# Patient Record
Sex: Male | Born: 1960 | Race: White | Hispanic: No | Marital: Married | Smoking: Never smoker
Health system: Southern US, Community
[De-identification: ages and names within clinical notes are randomized; demographics above are authoritative.]

## PROBLEM LIST (undated history)

## (undated) DIAGNOSIS — K219 Gastro-esophageal reflux disease without esophagitis: Secondary | ICD-10-CM

## (undated) DIAGNOSIS — M51379 Other intervertebral disc degeneration, lumbosacral region without mention of lumbar back pain or lower extremity pain: Secondary | ICD-10-CM

## (undated) DIAGNOSIS — M5137 Other intervertebral disc degeneration, lumbosacral region: Secondary | ICD-10-CM

## (undated) HISTORY — DX: Gastro-esophageal reflux disease without esophagitis: K21.9

## (undated) HISTORY — PX: ESOPHAGEAL DILATION: SHX303

## (undated) HISTORY — DX: Other intervertebral disc degeneration, lumbosacral region without mention of lumbar back pain or lower extremity pain: M51.379

## (undated) HISTORY — DX: Other intervertebral disc degeneration, lumbosacral region: M51.37

---

## 2004-04-11 ENCOUNTER — Ambulatory Visit (HOSPITAL_COMMUNITY): Admission: RE | Admit: 2004-04-11 | Discharge: 2004-04-11 | Payer: Self-pay | Admitting: Family Medicine

## 2005-10-05 ENCOUNTER — Encounter: Payer: Self-pay | Admitting: Orthopedic Surgery

## 2006-04-03 ENCOUNTER — Ambulatory Visit (HOSPITAL_COMMUNITY): Admission: RE | Admit: 2006-04-03 | Discharge: 2006-04-03 | Payer: Self-pay | Admitting: Family Medicine

## 2007-03-12 ENCOUNTER — Ambulatory Visit: Payer: Self-pay | Admitting: Internal Medicine

## 2007-03-12 ENCOUNTER — Ambulatory Visit (HOSPITAL_COMMUNITY): Admission: RE | Admit: 2007-03-12 | Discharge: 2007-03-12 | Payer: Self-pay | Admitting: Internal Medicine

## 2008-05-29 ENCOUNTER — Ambulatory Visit (HOSPITAL_COMMUNITY): Admission: RE | Admit: 2008-05-29 | Discharge: 2008-05-29 | Payer: Self-pay | Admitting: Family Medicine

## 2009-02-01 ENCOUNTER — Ambulatory Visit: Payer: Self-pay | Admitting: Orthopedic Surgery

## 2009-02-01 DIAGNOSIS — M19049 Primary osteoarthritis, unspecified hand: Secondary | ICD-10-CM | POA: Insufficient documentation

## 2009-02-01 DIAGNOSIS — M25559 Pain in unspecified hip: Secondary | ICD-10-CM | POA: Insufficient documentation

## 2009-04-29 ENCOUNTER — Ambulatory Visit (HOSPITAL_COMMUNITY): Admission: RE | Admit: 2009-04-29 | Discharge: 2009-04-29 | Payer: Self-pay | Admitting: Family Medicine

## 2009-04-29 ENCOUNTER — Encounter: Payer: Self-pay | Admitting: Orthopedic Surgery

## 2009-05-03 ENCOUNTER — Encounter (INDEPENDENT_AMBULATORY_CARE_PROVIDER_SITE_OTHER): Payer: Self-pay | Admitting: *Deleted

## 2009-05-03 ENCOUNTER — Ambulatory Visit: Payer: Self-pay | Admitting: Orthopedic Surgery

## 2009-05-03 DIAGNOSIS — M48061 Spinal stenosis, lumbar region without neurogenic claudication: Secondary | ICD-10-CM | POA: Insufficient documentation

## 2009-05-03 DIAGNOSIS — M5126 Other intervertebral disc displacement, lumbar region: Secondary | ICD-10-CM | POA: Insufficient documentation

## 2009-05-03 DIAGNOSIS — IMO0002 Reserved for concepts with insufficient information to code with codable children: Secondary | ICD-10-CM | POA: Insufficient documentation

## 2009-05-04 ENCOUNTER — Encounter (INDEPENDENT_AMBULATORY_CARE_PROVIDER_SITE_OTHER): Payer: Self-pay | Admitting: *Deleted

## 2009-05-04 ENCOUNTER — Encounter: Admission: RE | Admit: 2009-05-04 | Discharge: 2009-05-04 | Payer: Self-pay | Admitting: Orthopedic Surgery

## 2009-05-18 ENCOUNTER — Encounter: Admission: RE | Admit: 2009-05-18 | Discharge: 2009-05-18 | Payer: Self-pay | Admitting: Orthopedic Surgery

## 2009-08-09 ENCOUNTER — Encounter: Payer: Self-pay | Admitting: Orthopedic Surgery

## 2009-08-10 ENCOUNTER — Encounter (INDEPENDENT_AMBULATORY_CARE_PROVIDER_SITE_OTHER): Payer: Self-pay | Admitting: *Deleted

## 2009-08-10 ENCOUNTER — Encounter: Admission: RE | Admit: 2009-08-10 | Discharge: 2009-08-10 | Payer: Self-pay | Admitting: Orthopedic Surgery

## 2009-08-12 ENCOUNTER — Telehealth: Payer: Self-pay | Admitting: Orthopedic Surgery

## 2009-08-17 ENCOUNTER — Encounter (HOSPITAL_COMMUNITY): Admission: RE | Admit: 2009-08-17 | Discharge: 2009-09-16 | Payer: Self-pay | Admitting: Orthopedic Surgery

## 2009-11-29 ENCOUNTER — Encounter: Payer: Self-pay | Admitting: Orthopedic Surgery

## 2010-03-14 ENCOUNTER — Ambulatory Visit: Payer: Self-pay | Admitting: Orthopedic Surgery

## 2010-03-14 DIAGNOSIS — M542 Cervicalgia: Secondary | ICD-10-CM | POA: Insufficient documentation

## 2010-03-17 ENCOUNTER — Telehealth: Payer: Self-pay | Admitting: Orthopedic Surgery

## 2010-03-21 ENCOUNTER — Encounter: Payer: Self-pay | Admitting: Orthopedic Surgery

## 2010-03-24 ENCOUNTER — Encounter: Payer: Self-pay | Admitting: Orthopedic Surgery

## 2010-09-16 IMAGING — CR DG LUMBAR SPINE COMPLETE 4+V
5 series · 5 of 5 positions shown · non-contrast
Comparison: None

CLINICAL DATA: Low back pain, no known injury

LUMBAR SPINE - COMPLETE 4+ VIEW

[view not recorded (1 of 5)]
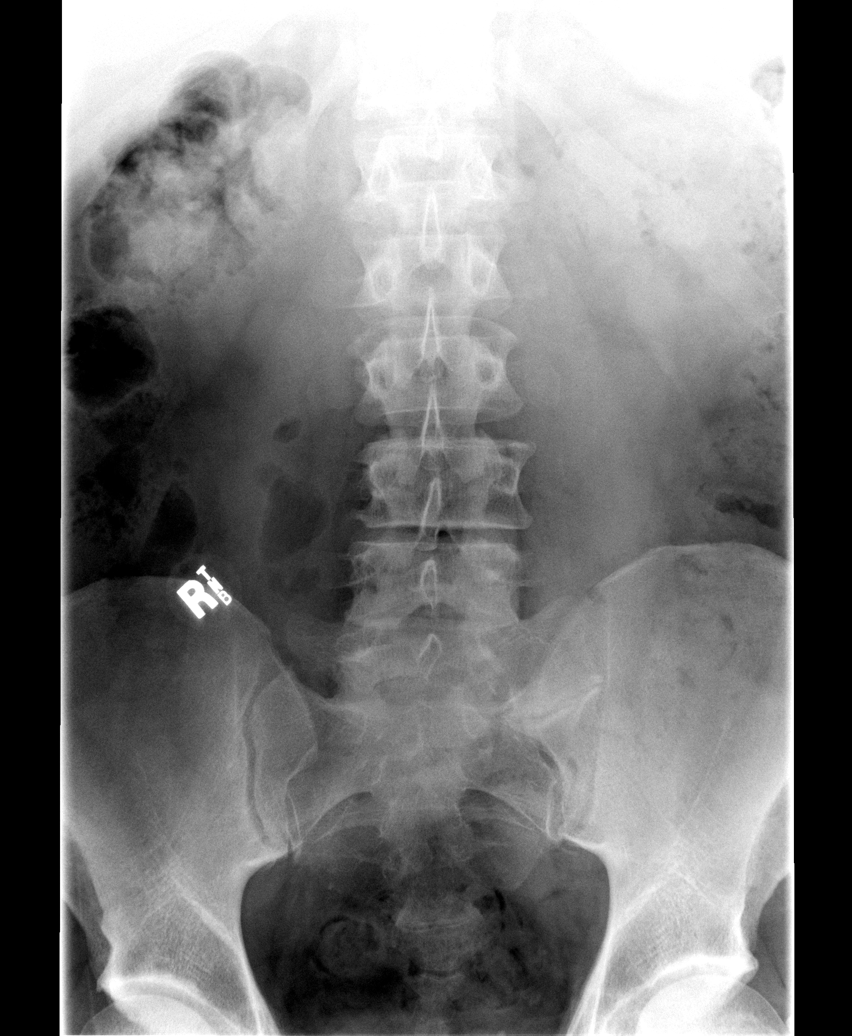

[view not recorded (2 of 5)]
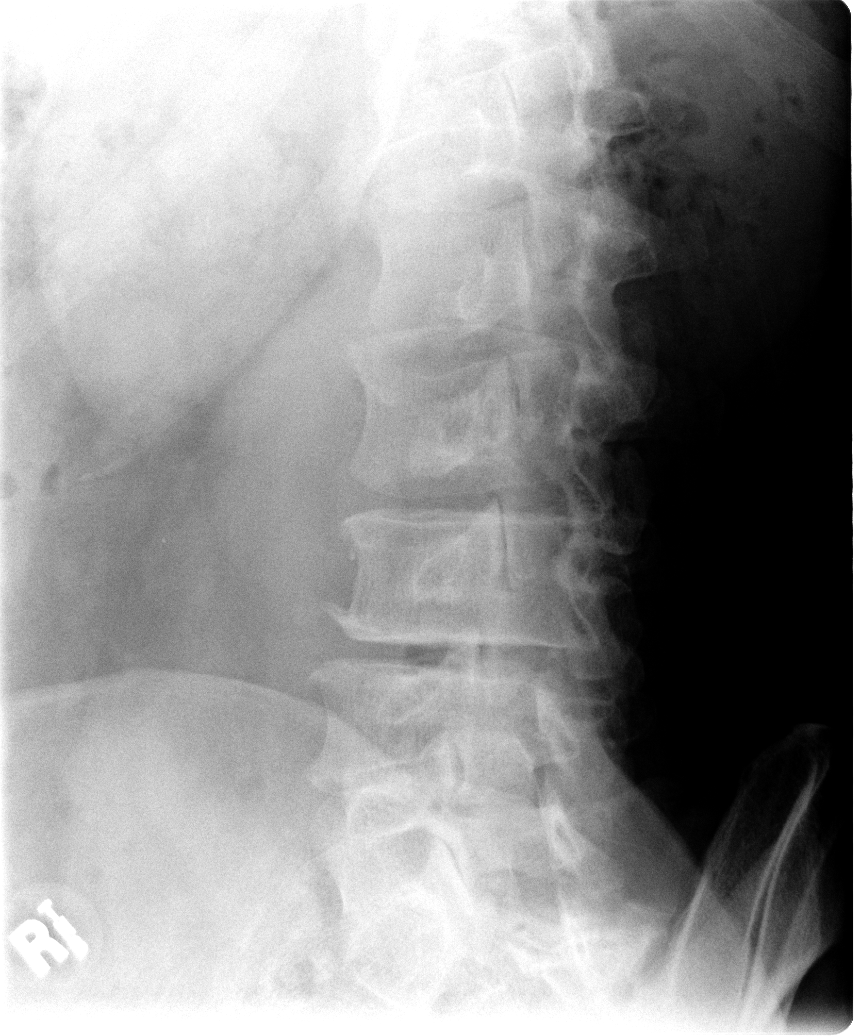

[view not recorded (3 of 5)]
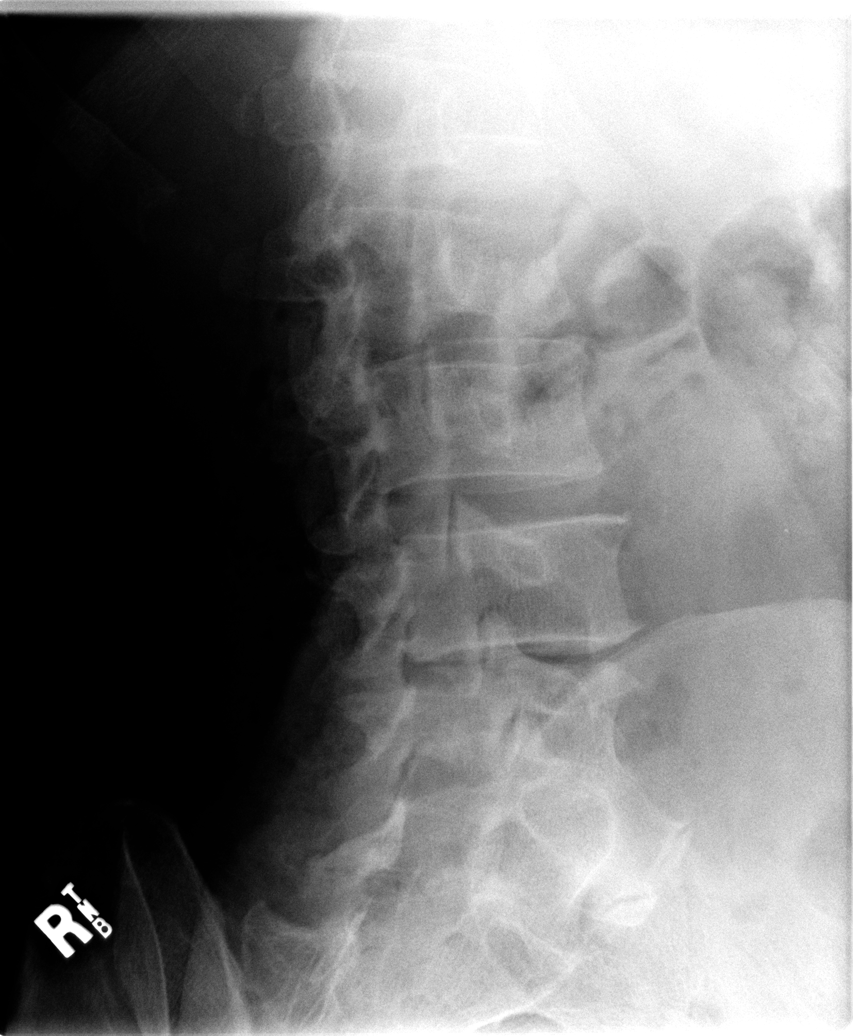

[view not recorded (4 of 5)]
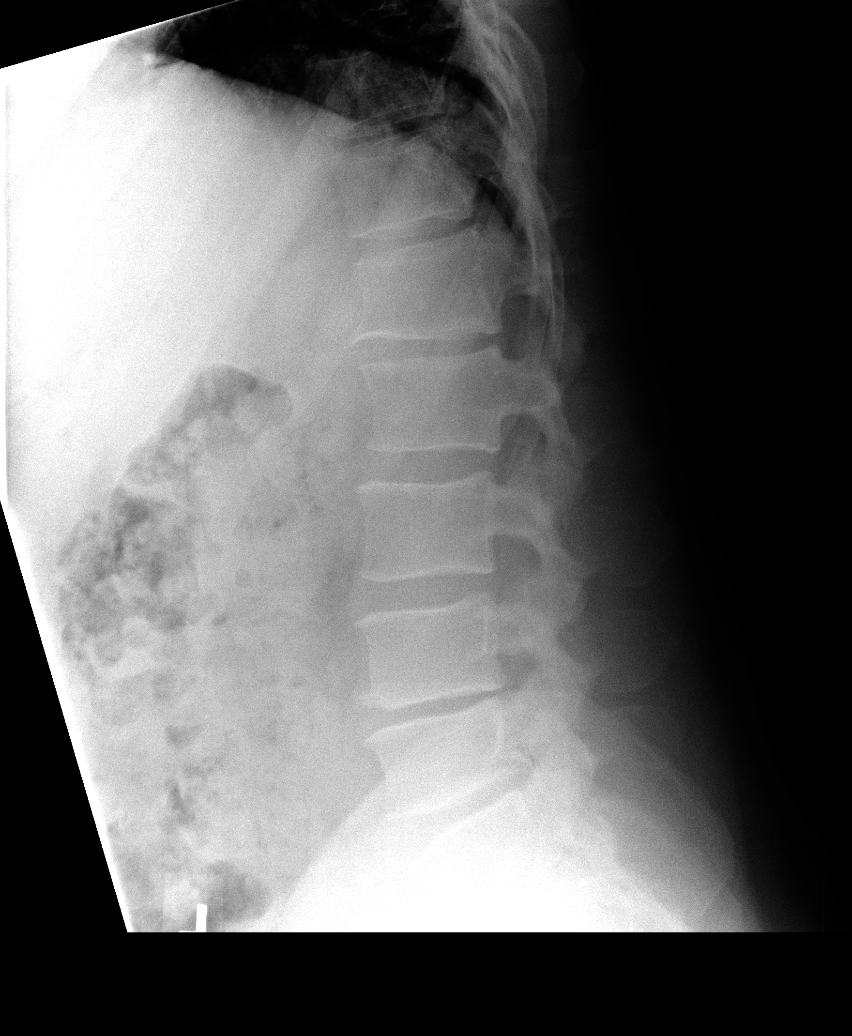

[view not recorded (5 of 5)]
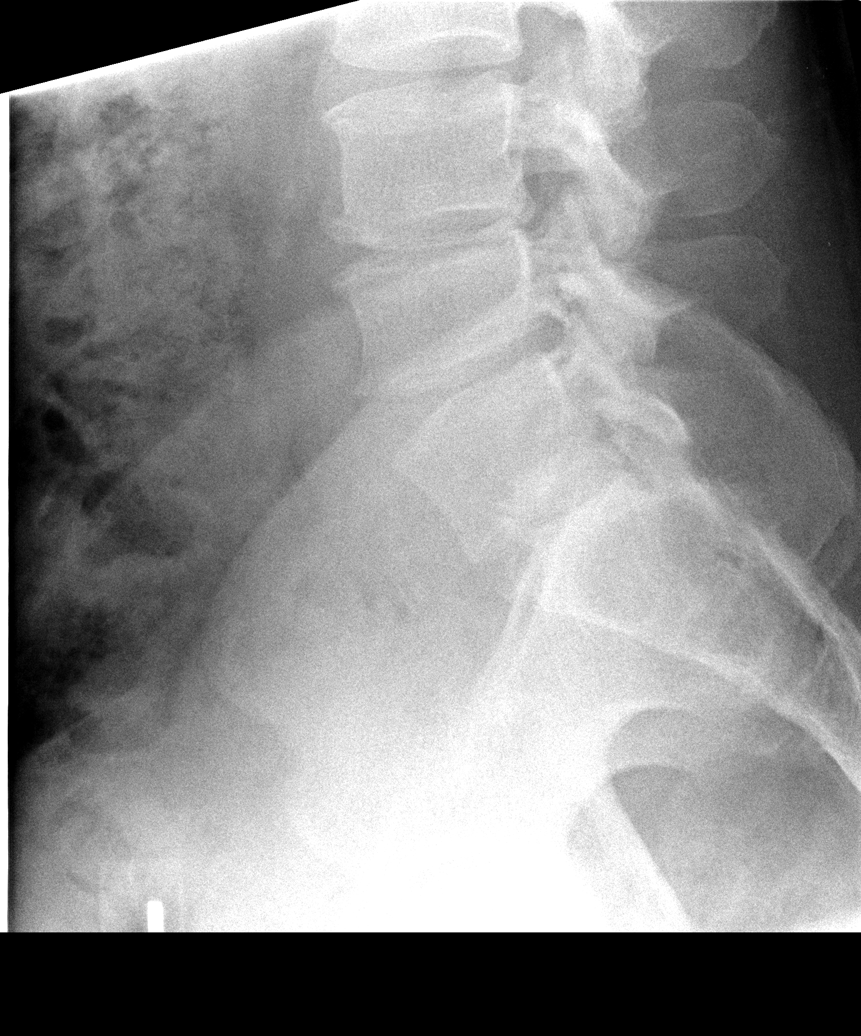

[5 of 5 positions shown; findings below may reference images not displayed]

FINDINGS: The lumbar vertebrae are in normal alignment.  There is
degenerative disc disease at L3-4 and to a lesser great L4-5 and L5-
S1.  No compression deformity is seen.  There is some degenerative
change involving the facet joints of the lower lumbar spine as
well.  The SI joints appear normal.
IMPRESSION: Degenerative changes of the lower lumbar spine as described above.
No acute compression deformity.

## 2011-01-24 NOTE — Letter (Signed)
----   Converted from flag ---- ---- 03/18/2010 12:45 PM, Fuller Canada MD wrote:  ------------------------------

## 2011-01-24 NOTE — Progress Notes (Signed)
Summary: please call or stop by to see this patient  Phone Note Call from Patient Call back at Home Phone [phone]   Summary of Call: will you please call this patient at [phone] or stop by his office today. Initial call taken by: Ether Griffins,  March 17, 2010 3:46 PM

## 2011-01-24 NOTE — Assessment & Plan Note (Signed)
Summary: NEW PROBLEM/SHOULDER PAIN/?XRAY/CAF   History of Present Illness: 50 year old male history of cervical disc disease treated with physical therapy and TENS unit as well as traction in the past presents now with cervical and left-sided neck pain.  Seems to have normal strength no radicular signs or symptoms tenderness over the trapezius and cervical spine level CV and 6  Recommend exercises, TENS unit, traction, flectorr patches,  Allergies: No Known Drug Allergies   Other Orders: No Charge Patient Arrived (NCPA0) (NCPA0)

## 2011-01-24 NOTE — Letter (Signed)
Summary: History form  History form   Imported By: Jacklynn Ganong 03/31/2010 08:14:07  _____________________________________________________________________  External Attachment:    Type:   Image     Comment:   External Document

## 2011-01-24 NOTE — Letter (Signed)
Summary: Office note from Dr. Channing Mutters  Office note from Dr. Channing Mutters   Imported By: Jacklynn Ganong 12/28/2009 15:47:32  _____________________________________________________________________  External Attachment:    Type:   Image     Comment:   External Document

## 2011-01-24 NOTE — Consult Note (Signed)
Summary: Consult [facility] Dr Channing Mutters  Consult [facility] Dr Channing Mutters   Imported By: Cammie Sickle 12/28/2009 18:53:00  _____________________________________________________________________  External Attachment:    Type:   Image     Comment:   External Document

## 2011-03-08 ENCOUNTER — Ambulatory Visit (HOSPITAL_COMMUNITY)
Admission: RE | Admit: 2011-03-08 | Discharge: 2011-03-08 | Disposition: A | Discharge status: Home / Self Care | Payer: BC Managed Care – PPO | Source: Ambulatory Visit | Attending: Internal Medicine | Admitting: Internal Medicine

## 2011-03-08 ENCOUNTER — Other Ambulatory Visit (INDEPENDENT_AMBULATORY_CARE_PROVIDER_SITE_OTHER): Payer: Self-pay | Admitting: Internal Medicine

## 2011-03-08 ENCOUNTER — Encounter (HOSPITAL_BASED_OUTPATIENT_CLINIC_OR_DEPARTMENT_OTHER): Payer: BC Managed Care – PPO | Admitting: Internal Medicine

## 2011-03-08 DIAGNOSIS — Z1211 Encounter for screening for malignant neoplasm of colon: Secondary | ICD-10-CM | POA: Insufficient documentation

## 2011-03-08 DIAGNOSIS — D126 Benign neoplasm of colon, unspecified: Secondary | ICD-10-CM

## 2011-03-08 DIAGNOSIS — K5730 Diverticulosis of large intestine without perforation or abscess without bleeding: Secondary | ICD-10-CM | POA: Insufficient documentation

## 2011-03-08 DIAGNOSIS — K644 Residual hemorrhoidal skin tags: Secondary | ICD-10-CM

## 2011-03-28 NOTE — Op Note (Signed)
  NAME:  Tyler Buchanan, Tyler Buchanan                 ACCOUNT NO.:  [id number]  MEDICAL RECORD NO.:  [id number]           PATIENT TYPE:  O  LOCATION:  DAYP                          FACILITY:  [facility]  PHYSICIAN:  Lionel December, M.D.    DATE OF BIRTH:  1961-11-05  DATE OF PROCEDURE: DATE OF DISCHARGE:                              OPERATIVE REPORT   PROCEDURE:  Colonoscopy.  INDICATIONS:  Tyler Buchanan is a 50 year old Caucasian male, who is undergoing average risk screening colonoscopy.  Procedure and risks were reviewed with the patient.  Informed consent was obtained.  MEDS FOR CONSCIOUS SEDATION:  Demerol 50 mg, IV Versed 10 mg IV.  FINDINGS:  Procedure performed in endoscopy suite.  The patient's vital signs and O2 sat were monitored during the procedure and remained stable.  The patient was placed in left lateral position.  Rectal examination performed.  No abnormality noted on external or digital exam.  Pentax videoscope was placed through rectum and advanced under vision and beyond.  Preparation was excellent.  He had a single small diverticulum at sigmoid colon.  Scope was passed into cecum, which was identified by appendiceal orifice and ileocecal valve.  Pictures taken for the record.  As the scope was withdrawn, colonic mucosa was carefully examined.  There was 5-6 mm polyp at distal sigmoid colon and possibly hyperplastic polyp.  This was ablated via cold biopsy.  The rest of the colon was normal.  Rectal mucosa similarly was normal. Scope was retroflexed to examine anorectal junction and small hemorrhoids noted above and below the dentate line.  Endoscope was then withdrawn.  Withdrawal time was 9 minutes.  The patient tolerated the procedure well.  FINAL DIAGNOSIS:  Examination performed to cecum.  Single diverticulum at sigmoid colon.  Small polyp ablated via cold biopsy from sigmoid colon.  RECOMMENDATIONS:  Stent instructions given.  I will be contacting the patient with results of  biopsy and further recommendations.     Lionel December, M.D.     NR/MEDQ  D:  03/08/2011  T:  03/08/2011  Job:  [id number]  cc:   Kirk Ruths, M.D. Fax: [phone]  Electronically Signed by Lionel December M.D. on 03/28/2011 09:58:22 AM

## 2011-04-04 ENCOUNTER — Telehealth: Payer: Self-pay | Admitting: Orthopedic Surgery

## 2011-04-04 ENCOUNTER — Ambulatory Visit (INDEPENDENT_AMBULATORY_CARE_PROVIDER_SITE_OTHER): Payer: BC Managed Care – PPO | Admitting: Orthopedic Surgery

## 2011-04-04 ENCOUNTER — Encounter: Payer: Self-pay | Admitting: Orthopedic Surgery

## 2011-04-04 DIAGNOSIS — M542 Cervicalgia: Secondary | ICD-10-CM

## 2011-04-04 MED ORDER — PREDNISONE (PAK) 10 MG PO TABS: 10.0000 mg | ORAL_TABLET | Freq: Every day | ORAL | Status: DC

## 2011-04-04 MED ORDER — METHOCARBAMOL 500 MG PO TABS: 500.0000 mg | ORAL_TABLET | Freq: Four times a day (QID) | ORAL | Status: DC

## 2011-04-04 NOTE — Progress Notes (Signed)
50 year old male with a history of previous evaluation for neck and shoulder pain treated for cervical spondylosis with traction, anti-inflammatories, and physical therapy. Presents back with one-week headaches and neck pain on the LEFT side radiating to the LEFT shoulder blade. Denies weakness, numbness, or tingling. Does complain of stiffness in his cervical spine with crepitance and flexing, extending, and lateral bend  Physical exam shows he does have stiffness of the cervical spine, especially with rotation to his LEFT and lateral bend to the LEFT. He has tenderness at the base of the cervical spine and palpation retrieved. The headaches.  He has some pain on the medial border of the LEFT scapula as well.  His upper extremities show no malalignment. Strength is normal. The shoulder down through his hand with equal reflexes and normal sensation.  Cervical spine x-ray was obtained that show spondylosis at C4-C5 and C5-C6. He has also lost the normal cervical lordosis. Impression cervical disc herniation versus spondylosis/spinal stenosis.  Recommend oral prednisone and Robaxin.  MRI cervical spine to evaluate disc

## 2011-04-04 NOTE — Patient Instructions (Signed)
MRI   Start medications

## 2011-04-04 NOTE — Telephone Encounter (Signed)
Called [employer] ph [phone]; reached automated system. Verified eligibility.  Left message re: pre-cert requirement for MRI C-spine w/o contrast, CPT [phone] (Direct to diagnostic imaging pre-cert: ph [phone])    Attempted online contact also - found new updated system online, not working.  * NOTE: PATIENT REQUESTS OPEN MRI, [facility] ph [phone], where he has had previous imaging.

## 2011-04-05 NOTE — Telephone Encounter (Signed)
Per call to [employer] fol/up on MRI pre-cert-fwd'd to Imaging pre-auth provider "[facility]" @ph  [phone], pre-authorization is required.  I received pre-cert for MRI, CPT [id number], per Albin Felling B:  # [id number]; effective 30 days, 04/05/11-05/04/11.  Patient prefers [facility] for location, as has had previous imaging at this location. Requests open MRI. Call to [facility], ph [phone], fax [phone], for open MRI spoke w/Sarah.  Appointment scheduled at this location ([address], [place]) for 04/06/11, 12:00 noon register for 12:30pm MRI.  Order faxed.  Called patient w/appointment information, including follow up appointment here for results.

## 2011-04-12 ENCOUNTER — Telehealth: Payer: Self-pay | Admitting: Orthopedic Surgery

## 2011-04-12 NOTE — Telephone Encounter (Signed)
Patient relates did not have the scheduled MRI (appointment was 04/10/11) at [facility] as he said his neck and shoulder feeling much better.  Assumes he is to cancel the MRI follow up appointment for tomorrow 04/12/11. His cell # is [phone]

## 2011-04-13 ENCOUNTER — Ambulatory Visit: Payer: BC Managed Care – PPO | Admitting: Orthopedic Surgery

## 2011-05-12 NOTE — Op Note (Signed)
NAME:  Tyler Buchanan, Tyler Buchanan                 ACCOUNT NO.:  [id number]   MEDICAL RECORD NO.:  [id number]          PATIENT TYPE:  AMB   LOCATION:  DAY                           FACILITY:  [facility]   PHYSICIAN:  Lionel December, M.D.    DATE OF BIRTH:  05/04/1961   DATE OF PROCEDURE:  03/12/2007  DATE OF DISCHARGE:                               OPERATIVE REPORT   Esophagogastroduodenoscopy with esophageal dilation.   INDICATION:  Delton See is a 50 year old Caucasian male with a four month  history of dysphagia to solids.  He also was having frequent heartburn  but this has been well controlled with a PPI.  The procedure risks were  reviewed with the patient and informed consent was obtained.   MEDS FOR CONSCIOUS SEDATION:  Benzocaine spray for pharyngeal topical  anesthesia, Demerol 100 mg IV in divided dose, Versed 18 mg IV in  divided dose, promethazine 12.5 mg IV.   FINDINGS:  The procedure was performed in the endoscopy suite.  The  patient's vital signs and O2 sat were monitored during procedure and  remained stable.  The patient was placed in the left lateral position  and the Pentax videoscope was passed via oropharynx without any  difficulty into esophagus, but he pulled the scope out.  It was hard to  sedate him and Phenergan dose seemed to help.  He was finally sedated to  the point the scope could be passed with him not trying to pull it out.   Esophagus:  The mucosa of the esophagus was normal.  No ring or  stricture was noted.  There was a 5 mm erosion at the GE junction which  appeared to be on the way to healing.  The GE junction was wavy.  It was  at 40 cm from the incisors and the hiatus was at 43.  A small sliding  hiatal hernia.   Stomach:  It was empty and distended very well with insufflation.  The  folds of the proximal stomach were normal.  Examination of the mucosa at  body, antrum, pyloric channel, as well as angularis, fundus and cardia  was normal.   Duodenum:  The  bulbar mucosa was normal.  The scope was passed to the  second part of the duodenum where mucosa and folds were normal.  The  endoscope was withdrawn.   The esophagus was dilated by passing a 56-French Maloney dilator to full  insertion.  As the dilators was withdrawn, the endoscope was passed  again and no disruption noted to esophageal mucosa.  The endoscope was  withdrawn.  The patient tolerated the procedure well.   FINAL DIAGNOSIS:  Erosive reflux esophagitis with small sliding hiatal  hernia but no evidence of stricture or of web formation.  The esophagus  was dilated by passing the 56-French [facility] dilator without mucosal  disruptions.  Normal exam of the stomach, first and second part of the  duodenum.   RECOMMENDATIONS:  1. Antireflux measures reinforced.  2. Will increase omeprazole to 20 mg p.o. b.i.d., prescription given      for 60 with  five refills.  3. He will call us with a progress report next week.  If he remains      with dysphagia, we will bring him back for barium pill study.      Lionel December, M.D.  Electronically Signed     NR/MEDQ  D:  03/12/2007  T:  03/12/2007  Job:  [id number]   cc:   Kirk Ruths, M.D.  Fax: [phone]

## 2011-06-05 ENCOUNTER — Telehealth: Payer: Self-pay | Admitting: Orthopedic Surgery

## 2011-06-05 ENCOUNTER — Other Ambulatory Visit: Payer: Self-pay | Admitting: Orthopedic Surgery

## 2011-06-05 DIAGNOSIS — M545 Low back pain, unspecified: Secondary | ICD-10-CM

## 2011-06-05 DIAGNOSIS — M79604 Pain in right leg: Secondary | ICD-10-CM

## 2011-06-05 NOTE — Telephone Encounter (Signed)
Ok to repeat 

## 2011-06-05 NOTE — Telephone Encounter (Signed)
Forwarding to nurse re: order for ESI per Dr. Mort Sawyers response "okay to repeat."

## 2011-06-05 NOTE — Telephone Encounter (Signed)
Patient stopped in, Dr. Romeo Apple out of office. Patient requests order foranother epidural steroid injection, as states his lower back is "really hurting in the same spot."  He had 3 ESI's at [facility] in 2010.  Insurance is same [employer] as per last office visit in 2012.  His cell # is [phone].  Please advise.

## 2011-06-05 NOTE — Telephone Encounter (Signed)
Faxed order over to [facility]

## 2011-06-06 ENCOUNTER — Ambulatory Visit
Admission: RE | Admit: 2011-06-06 | Discharge: 2011-06-06 | Disposition: A | Discharge status: Home / Self Care | Payer: BC Managed Care – PPO | Source: Ambulatory Visit | Attending: Orthopedic Surgery | Admitting: Orthopedic Surgery

## 2011-06-06 DIAGNOSIS — M545 Low back pain, unspecified: Secondary | ICD-10-CM

## 2011-06-06 DIAGNOSIS — M79604 Pain in right leg: Secondary | ICD-10-CM

## 2011-06-07 ENCOUNTER — Other Ambulatory Visit: Payer: BC Managed Care – PPO

## 2011-06-14 ENCOUNTER — Other Ambulatory Visit: Payer: Self-pay | Admitting: Orthopedic Surgery

## 2011-06-14 DIAGNOSIS — M545 Low back pain, unspecified: Secondary | ICD-10-CM

## 2011-06-14 DIAGNOSIS — M79604 Pain in right leg: Secondary | ICD-10-CM

## 2011-06-20 ENCOUNTER — Ambulatory Visit
Admission: RE | Admit: 2011-06-20 | Discharge: 2011-06-20 | Disposition: A | Discharge status: Home / Self Care | Payer: BC Managed Care – PPO | Source: Ambulatory Visit | Attending: Orthopedic Surgery | Admitting: Orthopedic Surgery

## 2011-06-20 DIAGNOSIS — M79604 Pain in right leg: Secondary | ICD-10-CM

## 2011-06-20 DIAGNOSIS — M545 Low back pain, unspecified: Secondary | ICD-10-CM

## 2011-12-04 ENCOUNTER — Ambulatory Visit (INDEPENDENT_AMBULATORY_CARE_PROVIDER_SITE_OTHER): Payer: BC Managed Care – PPO | Admitting: Orthopedic Surgery

## 2011-12-04 ENCOUNTER — Encounter: Payer: Self-pay | Admitting: Orthopedic Surgery

## 2011-12-04 ENCOUNTER — Ambulatory Visit: Payer: BC Managed Care – PPO | Admitting: Orthopedic Surgery

## 2011-12-04 DIAGNOSIS — M1710 Unilateral primary osteoarthritis, unspecified knee: Secondary | ICD-10-CM

## 2011-12-04 DIAGNOSIS — IMO0002 Reserved for concepts with insufficient information to code with codable children: Secondary | ICD-10-CM

## 2011-12-04 NOTE — Progress Notes (Signed)
The patient reports pain in his RIGHT foot. Initially after playing golf and then passing to get better, but she started having medial knee pain. He is also having a re\re currents of his previously diagnosed lumbar disc problems status post 2 epidurals. He is considering a third.  The pain on the medial side of the knee is dull burning. It seems to be present when he is ambulating and walking. The pain is between a 5 and an 8 although he does take 2 Aleve on an as-needed basis. Is not having any bowel or bladder dysfunction. At this time.  He picked up some heavy about a week or 2 ago and started having recurrence of symptoms in his back and RIGHT leg up to the level of his knee. This is a separate pain from the one felt on the medial side of the knee.  He denies catching, locking, or giving way. He does have some seasonal allergies.  He has no major surgeries listed and he is not or does not report any major medical problems.  Exam reveals well-developed, well-nourished, male coming in hygiene is normal. He is oriented x3. His mood is pleasant. End plate without assistive device and without a limp. His RIGHT foot is tender over the posterior tibial tendon. 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 His ankle is stable. His tendon shrink seems normal. However. The primary area of tenderness is over the posterior medial malleolus. Skin is intact. Ankle joint is stable. Good pulse in temperature noted and the limb.  Knee is tender over the medial joint line. Full range of motion is recorded. There is no swelling, and meniscal signs are negative. Muscle tone is normal.  We did shoot an x-ray of his knee, which shows mild symmetric joint space narrowing, primarily medial greater than lateral.  Impression #1 mild arthritis, RIGHT knee. Impression #2 mild tendinitis posterior tibial tendon, RIGHT foot and ankle. Impression #3 recurrent chronic  lumbar disc disease.  Plan recommend Aspercreme and continued medication over-the-counter for the knee as he is not having any catching, locking, or giving way.  Plan #2 Spenco for orthotics.  Plan #3. He will seek a third epidural injection.

## 2011-12-04 NOTE — Progress Notes (Signed)
Separate x-ray report 3 views, RIGHT knee.  Diagnosis RIGHT knee pain, possible arthritis.  AP, lateral, and patellar views of the RIGHT knee are obtained.  There is symmetric. Mild joint space narrowing. Appears to affect the medial compartment more than lateral. No other abnormalities are seen.  Impression normal alignment of the RIGHT knee with mild arthritis.

## 2011-12-04 NOTE — Patient Instructions (Signed)
ORTHOTICS FOR YOUR FEET   ASPERCREME TWICE A DAY APPLY TO SORE AREA ON KNEE

## 2011-12-06 ENCOUNTER — Other Ambulatory Visit: Payer: Self-pay | Admitting: Orthopedic Surgery

## 2011-12-06 ENCOUNTER — Ambulatory Visit
Admission: RE | Admit: 2011-12-06 | Discharge: 2011-12-06 | Disposition: A | Discharge status: Home / Self Care | Payer: BC Managed Care – PPO | Source: Ambulatory Visit | Attending: Orthopedic Surgery | Admitting: Orthopedic Surgery

## 2011-12-06 ENCOUNTER — Telehealth: Payer: Self-pay | Admitting: Radiology

## 2011-12-06 DIAGNOSIS — M545 Low back pain, unspecified: Secondary | ICD-10-CM

## 2011-12-06 MED ORDER — METHYLPREDNISOLONE ACETATE 40 MG/ML INJ SUSP (RADIOLOG
120.0000 mg | Freq: Once | INTRAMUSCULAR | Status: AC
  Administered 2011-12-06: 120 mg via EPIDURAL

## 2011-12-06 MED ORDER — IOHEXOL 180 MG/ML  SOLN
1.0000 mL | Freq: Once | INTRAMUSCULAR | Status: AC | PRN
  Administered 2011-12-06: 1 mL via EPIDURAL

## 2011-12-06 NOTE — Telephone Encounter (Signed)
I faxed an order for this patient to [facility] for ESI injections.

## 2012-07-15 ENCOUNTER — Telehealth: Payer: Self-pay | Admitting: Orthopedic Surgery

## 2012-07-15 NOTE — Telephone Encounter (Signed)
Patient called to relay that his back is hurting again, and that he called [facility] to have "the second of the three epidural steroid injections", and said he was told his order is expired.  He requests new order.  His cell ph # is [phone].

## 2012-07-15 NOTE — Telephone Encounter (Signed)
Ok

## 2012-07-16 ENCOUNTER — Other Ambulatory Visit: Payer: Self-pay | Admitting: *Deleted

## 2012-07-16 ENCOUNTER — Other Ambulatory Visit: Payer: Self-pay | Admitting: Orthopedic Surgery

## 2012-07-16 DIAGNOSIS — M545 Low back pain, unspecified: Secondary | ICD-10-CM

## 2012-07-16 NOTE — Telephone Encounter (Signed)
Order has been faxed to [facility]

## 2012-07-16 NOTE — Telephone Encounter (Signed)
Order faxed, patient aware

## 2014-05-26 ENCOUNTER — Ambulatory Visit (INDEPENDENT_AMBULATORY_CARE_PROVIDER_SITE_OTHER): Payer: BC Managed Care – PPO | Admitting: Orthopedic Surgery

## 2014-05-26 VITALS — Ht 75.0 in | Wt 247.0 lb

## 2014-05-26 DIAGNOSIS — M25519 Pain in unspecified shoulder: Secondary | ICD-10-CM

## 2014-05-26 DIAGNOSIS — M67919 Unspecified disorder of synovium and tendon, unspecified shoulder: Secondary | ICD-10-CM

## 2014-05-26 DIAGNOSIS — M25511 Pain in right shoulder: Secondary | ICD-10-CM

## 2014-05-26 DIAGNOSIS — M719 Bursopathy, unspecified: Secondary | ICD-10-CM

## 2014-05-26 NOTE — Patient Instructions (Addendum)
Bursitis Bursitis is a swelling and soreness (inflammation) of a fluid-filled sac (bursa) that overlies and protects a joint. It can be caused by injury, overuse of the joint, arthritis or infection. The joints most likely to be affected are the elbows, shoulders, hips and knees. HOME CARE INSTRUCTIONS   Apply ice to the affected area for 15-20 minutes each hour while awake for 2 days. Put the ice in a plastic bag and place a towel between the bag of ice and your skin.  Rest the injured joint as much as possible, but continue to put the joint through a full range of motion, 4 times per day. (The shoulder joint especially becomes rapidly "frozen" if not used.) When the pain lessens, begin normal slow movements and usual activities.  Only take over-the-counter or prescription medicines for pain, discomfort or fever as directed by your caregiver.  Your caregiver may recommend draining the bursa and injecting medicine into the bursa. This may help the healing process.  Follow all instructions for follow-up with your caregiver. This includes any orthopedic referrals, physical therapy and rehabilitation. Any delay in obtaining necessary care could result in a delay or failure of the bursitis to heal and chronic pain. SEEK IMMEDIATE MEDICAL CARE IF:   Your pain increases even during treatment.  You develop an oral temperature above 102 F (38.9 C) and have heat and inflammation over the involved bursa. MAKE SURE YOU:   Understand these instructions.  Will watch your condition.  Will get help right away if you are not doing well or get worse. Document Released: 12/08/2000 Document Revised: 03/04/2012 Document Reviewed: 11/12/2009 [facility] Patient Information 2014 [place]. Trigger Point Injection Trigger points are areas where you have muscle pain. A trigger point injection is a shot given in the trigger point to relieve that pain. A trigger point might feel like a knot in your muscle. It  hurts to press on a trigger point. Sometimes the pain spreads out (radiates) to other parts of the body. For example, pressing on a trigger point in your shoulder might cause pain in your arm or neck. You might have one trigger point. Or, you might have more than one. People often have trigger points in their upper back and lower back. They also occur often in the neck and shoulders. Pain from a trigger point lasts for a long time. It can make it hard to keep moving. You might not be able to do the exercise or physical therapy that could help you deal with the pain. A trigger point injection may help. It does not work for everyone. But, it may relieve your pain for a few days or a few months. A trigger point injection does not cure long-lasting (chronic) pain. LET YOUR CAREGIVER KNOW ABOUT:  Any allergies (especially to latex, lidocaine, or steroids).  Blood-thinning medicines that you take. These drugs can lead to bleeding or bruising after an injection. They include:  Aspirin.  Ibuprofen.  Clopidogrel.  Warfarin.  Other medicines you take. This includes all vitamins, herbs, eyedrops, over-the-counter medicines, and creams.  Use of steroids.  Recent infections.  Past problems with numbing medicines.  Bleeding problems.  Surgeries you have had.  Other health problems. RISKS AND COMPLICATIONS A trigger point injection is a safe treatment. However, problems may develop, such as:  Minor side effects usually go away in 1 to 2 days. These may include:  Soreness.  Bruising.  Stiffness.  More serious problems are rare. But, they may include:  Bleeding under the skin (hematoma).  Skin infection.  Breaking off of the needle under your skin.  Lung puncture.  The trigger point injection may not work for you. BEFORE THE PROCEDURE You may need to stop taking any medicine that thins your blood. This is to prevent bleeding and bruising. Usually these medicines are stopped several  days before the injection. No other preparation is needed. PROCEDURE  A trigger point injection can be given in your caregiver's office or in a clinic. Each injection takes 2 minutes or less.  Your caregiver will feel for trigger points. The caregiver may use a marker to circle the area for the injection.  The skin over the trigger point will be washed with a germ-killing (antiseptic) solution.  The caregiver pinches the spot for the injection.  Then, a very thin needle is used for the shot. You may feel pain or a twitching feeling when the needle enters the trigger point.  A numbing solution may be injected into the trigger point. Sometimes a drug to keep down swelling, redness, and warmth (inflammation) is also injected.  Your caregiver moves the needle around the trigger zone until the tightness and twitching goes away.  After the injection, your caregiver may put gentle pressure over the injection site.  Then it is covered with a bandage. AFTER THE PROCEDURE  You can go right home after the injection.  The bandage can be taken off after a few hours.  You may feel sore and stiff for 1 to 2 days.  Go back to your regular activities slowly. Your caregiver may ask you to stretch your muscles. Do not do anything that takes extra energy for a few days.  Follow your caregiver's instructions to manage and treat other pain. Document Released: 11/30/2011 Document Revised: 04/07/2013 Document Reviewed: 11/30/2011 [facility] Patient Information 2014 [place], [place].

## 2014-05-28 ENCOUNTER — Encounter: Payer: Self-pay | Admitting: Orthopedic Surgery

## 2014-05-28 DIAGNOSIS — M25511 Pain in right shoulder: Secondary | ICD-10-CM | POA: Insufficient documentation

## 2014-05-28 DIAGNOSIS — M719 Bursopathy, unspecified: Secondary | ICD-10-CM

## 2014-05-28 DIAGNOSIS — M67919 Unspecified disorder of synovium and tendon, unspecified shoulder: Secondary | ICD-10-CM | POA: Insufficient documentation

## 2014-05-28 NOTE — Progress Notes (Signed)
Patient ID: Tyler Buchanan, male   DOB: 05/26/1961, 53 y.o.   MRN: [id number]  No chief complaint on file.   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 Duration of approximately one to 2 weeks. The patient drove up to late last week and noticed the periscapular pain. He played 8 days of golf he lifted weights. He had no difficulty lifting weights but had actually experienced decreased pain when he exercises the shoulder  It is sharp stabbing aching pain is constant 8/10 it is unrelieved by Advil is worse when he is driving his car. He has no mite pain or temperature related symptoms  He is healthy his only medications are Valium 5 mg and Prilosec 20 mg he has no allergies he has a family history of cancer his parents are alive his review of systems is negative except for some heartburn  His vital signs are stable his appearance is normal he is oriented x3 his mood and affect are normal  He walks with an abnormal heel to toe gait  Is no tenderness or stiffness in his neck.  He has tenderness at the superior angle of the scapula with reproduction of symptoms. Range of motion right shoulder is normal. There is no instability on testing. He has normal strength in the rotator cuff. There are no skin lesions. Has a normal pulse in his right wrist. There are no sensory deficits in the right upper extremity and his lymph nodes were negative  Impression  Encounter Diagnoses  Name Primary?  . Trigger point of right shoulder region Yes  . 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 The shoulder was prepped withalcohol  and the skin was anesthetized. A 20 gauge needle was advanced into the subacromial space through posterior approach without difficulty  The space was then injected with 3 ml 1% lidocaine and 1 ml of depomedrol. The injection site was cleansed with isopropyl alcohol and a dressing was applied.  Complications:  None; patient tolerated the procedure well.  Procedure report   Preoperative diagnosis trigger point RIGHT shoulder, scapula Postop diagnosis same Anesthetic ethyl chloride Prepped with alcohol One-to-one mixture of Depo-Medrol 40 mg per cc and lidocaine 1% injected point of maximal tenderness superior angle of the sca

## 2014-11-24 ENCOUNTER — Other Ambulatory Visit (INDEPENDENT_AMBULATORY_CARE_PROVIDER_SITE_OTHER): Payer: Self-pay | Admitting: *Deleted

## 2014-11-24 ENCOUNTER — Ambulatory Visit (INDEPENDENT_AMBULATORY_CARE_PROVIDER_SITE_OTHER): Payer: BC Managed Care – PPO | Admitting: Internal Medicine

## 2014-11-24 ENCOUNTER — Encounter (INDEPENDENT_AMBULATORY_CARE_PROVIDER_SITE_OTHER): Payer: Self-pay | Admitting: *Deleted

## 2014-11-24 ENCOUNTER — Encounter (INDEPENDENT_AMBULATORY_CARE_PROVIDER_SITE_OTHER): Payer: Self-pay | Admitting: Internal Medicine

## 2014-11-24 VITALS — BP 116/54 | HR 72 | Temp 97.6°F | Ht 75.0 in | Wt 248.8 lb

## 2014-11-24 DIAGNOSIS — R1314 Dysphagia, pharyngoesophageal phase: Secondary | ICD-10-CM

## 2014-11-24 DIAGNOSIS — K219 Gastro-esophageal reflux disease without esophagitis: Secondary | ICD-10-CM

## 2014-11-24 DIAGNOSIS — R1310 Dysphagia, unspecified: Secondary | ICD-10-CM

## 2014-11-24 NOTE — Patient Instructions (Addendum)
EGD/ED. Dexilant one 30 minutes before breakfast. Stop the Prilosec. Stop Aleve.

## 2014-11-24 NOTE — Progress Notes (Signed)
   Subjective:    Patient ID: Tyler Buchanan, male    DOB: 1961-07-31, 53 y.o.   MRN: [id number]  HPI Presents today with c/o reflux. He says it is consistent.  Acid reflux occurring very frequent.  Occasionally has dysphagia.  Foods are slow to go down and sometimes they will lodge. Appetite is good. No weight loss. Sometimes he will cough up his food.   BMs are normal.  Has been taking Aleve  2 tabs BID for arthritic pain  EGD/ED 03/12/2007  INDICATION:  Tyler Buchanan is a 53 year old Caucasian male with a four month  history of dysphagia to solids.  He also was having frequent heartburn  but this has been well controlled with a PPI.  The procedure risks were  reviewed with the patient and informed consent was  FINAL DIAGNOSIS:  Erosive reflux esophagitis with small sliding hiatal  hernia but no evidence of stricture or of web formation.  The esophagus  was dilated by passing the 56-[facility] dilator without mucosal  disruptions.  Normal exam of the stomach, first and second part of the  duodenum.   Review of Systems Past Medical History  Diagnosis Date  . Acid reflux   . DDD (degenerative disc disease), lumbosacral   . Allergic rhinitis     Past Surgical History  Procedure Laterality Date  . Esophageal dilation      No Known Allergies  Current Outpatient Prescriptions on File Prior to Visit  Medication Sig Dispense Refill  . diazepam (VALIUM) 5 MG tablet Take 5 mg by mouth at bedtime as needed.      . fexofenadine (ALLEGRA) 180 MG tablet Take 180 mg by mouth daily.      Marland Kitchen omeprazole (PRILOSEC) 20 MG capsule Take 20 mg by mouth 2 (two) times daily.       No current facility-administered medications on file prior to visit.        Objective:   Physical Exam  Filed Vitals:   11/24/14 0938  Height: 6\' 3"  (1.905 m)  Weight: 248 lb 12.8 oz (112.855 kg)    Alert and oriented. Skin warm and dry. Oral mucosa is moist.   . Sclera anicteric, conjunctivae is pink. Thyroid not  enlarged. No cervical lymphadenopathy. Lungs clear. Heart regular rate and rhythm.  Abdomen is soft. Bowel sounds are positive. No hepatomegaly. No abdominal masses felt. No tenderness.  No edema to lower extremities.         Assessment & Plan:  GERD not controlled at this time. Hx of ereosive esophagitis. Solid food dysphagia. EGD/ED. Stop the Aleve and try Tylenol Arthritis. Samples of Dexilant # 4 boxes given to patient.

## 2014-12-04 ENCOUNTER — Encounter (HOSPITAL_COMMUNITY)
Admission: RE | Disposition: A | Discharge status: Home / Self Care | Payer: Self-pay | Source: Ambulatory Visit | Attending: Internal Medicine

## 2014-12-04 ENCOUNTER — Ambulatory Visit (HOSPITAL_COMMUNITY)
Admission: RE | Admit: 2014-12-04 | Discharge: 2014-12-04 | Disposition: A | Discharge status: Home / Self Care | Payer: BC Managed Care – PPO | Source: Ambulatory Visit | Attending: Internal Medicine | Admitting: Internal Medicine

## 2014-12-04 ENCOUNTER — Encounter (HOSPITAL_COMMUNITY): Payer: Self-pay | Admitting: *Deleted

## 2014-12-04 DIAGNOSIS — K449 Diaphragmatic hernia without obstruction or gangrene: Secondary | ICD-10-CM | POA: Diagnosis not present

## 2014-12-04 DIAGNOSIS — K2210 Ulcer of esophagus without bleeding: Secondary | ICD-10-CM

## 2014-12-04 DIAGNOSIS — Z8739 Personal history of other diseases of the musculoskeletal system and connective tissue: Secondary | ICD-10-CM | POA: Insufficient documentation

## 2014-12-04 DIAGNOSIS — J309 Allergic rhinitis, unspecified: Secondary | ICD-10-CM | POA: Diagnosis not present

## 2014-12-04 DIAGNOSIS — M5137 Other intervertebral disc degeneration, lumbosacral region: Secondary | ICD-10-CM | POA: Diagnosis not present

## 2014-12-04 DIAGNOSIS — K210 Gastro-esophageal reflux disease with esophagitis: Secondary | ICD-10-CM | POA: Diagnosis not present

## 2014-12-04 DIAGNOSIS — Z8639 Personal history of other endocrine, nutritional and metabolic disease: Secondary | ICD-10-CM | POA: Diagnosis not present

## 2014-12-04 DIAGNOSIS — Z859 Personal history of malignant neoplasm, unspecified: Secondary | ICD-10-CM | POA: Insufficient documentation

## 2014-12-04 DIAGNOSIS — R1310 Dysphagia, unspecified: Secondary | ICD-10-CM | POA: Diagnosis present

## 2014-12-04 DIAGNOSIS — K219 Gastro-esophageal reflux disease without esophagitis: Secondary | ICD-10-CM

## 2014-12-04 HISTORY — PX: MALONEY DILATION: SHX5535

## 2014-12-04 HISTORY — PX: ESOPHAGOGASTRODUODENOSCOPY: SHX5428

## 2014-12-04 SURGERY — EGD (ESOPHAGOGASTRODUODENOSCOPY)
Anesthesia: Moderate Sedation

## 2014-12-04 MED ORDER — SODIUM CHLORIDE 0.9 % IV SOLN
INTRAVENOUS | Status: DC
  Administered 2014-12-04: 10:00:00 via INTRAVENOUS

## 2014-12-04 MED ORDER — MEPERIDINE HCL 50 MG/ML IJ SOLN
INTRAMUSCULAR | Status: DC
Start: 2014-12-04 — End: 2014-12-04
  Filled 2014-12-04: qty 1

## 2014-12-04 MED ORDER — PROMETHAZINE HCL 25 MG/ML IJ SOLN
INTRAMUSCULAR | Status: AC
  Filled 2014-12-04: qty 1

## 2014-12-04 MED ORDER — STERILE WATER FOR IRRIGATION IR SOLN
Status: DC | PRN
  Administered 2014-12-04: 11:00:00

## 2014-12-04 MED ORDER — BUTAMBEN-TETRACAINE-BENZOCAINE 2-2-14 % EX AERO
INHALATION_SPRAY | CUTANEOUS | Status: DC | PRN
  Administered 2014-12-04: 2 via TOPICAL

## 2014-12-04 MED ORDER — PROMETHAZINE HCL 25 MG/ML IJ SOLN
INTRAMUSCULAR | Status: DC | PRN
  Administered 2014-12-04: 25 mg via INTRAVENOUS

## 2014-12-04 MED ORDER — MEPERIDINE HCL 50 MG/ML IJ SOLN
INTRAMUSCULAR | Status: DC | PRN
  Administered 2014-12-04 (×4): 25 mg via INTRAVENOUS

## 2014-12-04 MED ORDER — MIDAZOLAM HCL 5 MG/5ML IJ SOLN
INTRAMUSCULAR | Status: DC | PRN
Start: 2014-12-04 — End: 2014-12-04
  Administered 2014-12-04 (×4): 3 mg via INTRAVENOUS

## 2014-12-04 MED ORDER — SODIUM CHLORIDE 0.9 % IJ SOLN
INTRAMUSCULAR | Status: DC | PRN
  Administered 2014-12-04: 10 mL via INTRAVENOUS

## 2014-12-04 MED ORDER — MIDAZOLAM HCL 5 MG/5ML IJ SOLN
INTRAMUSCULAR | Status: AC
  Filled 2014-12-04: qty 10

## 2014-12-04 MED ORDER — MIDAZOLAM HCL 5 MG/5ML IJ SOLN
INTRAMUSCULAR | Status: AC
  Filled 2014-12-04: qty 5

## 2014-12-04 MED ORDER — MEPERIDINE HCL 50 MG/ML IJ SOLN
INTRAMUSCULAR | Status: AC
  Filled 2014-12-04: qty 1

## 2014-12-04 NOTE — Discharge Instructions (Signed)
Resume usual medications and diet. Can use Zantac OTC 150 mg Pepcid OTC 20 mg for breakthrough symptoms one as-needed basis or when you late in the evening. No driving for 24 hours. Physician will call with biopsy results.  Esophagogastroduodenoscopy Care After Refer to this sheet in the next few weeks. These instructions provide you with information on caring for yourself after your procedure. Your caregiver may also give you more specific instructions. Your treatment has been planned according to current medical practices, but problems sometimes occur. Call your caregiver if you have any problems or questions after your procedure.  HOME CARE INSTRUCTIONS  Do not eat or drink anything until the numbing medicine (local anesthetic) has worn off and your gag reflex has returned. You will know that the local anesthetic has worn off when you can swallow comfortably.  Do not drive for 12 hours after the procedure or as directed by your caregiver.  Only take medicines as directed by your caregiver. SEEK MEDICAL CARE IF:   You cannot stop coughing.  You are not urinating at all or less than usual. SEEK IMMEDIATE MEDICAL CARE IF:  You have difficulty swallowing.  You cannot eat or drink.  You have worsening throat or chest pain.  You have dizziness, lightheadedness, or you faint.  You have nausea or vomiting.  You have chills.  You have a fever.  You have severe abdominal pain.  You have black, tarry, or bloody stools. Document Released: 11/27/2012 Document Reviewed: 11/27/2012 [facility] Patient Information 2015 [place]. This information is not intended to replace advice given to you by your health care provider. Make sure you discuss any questions you have with your health care provider.

## 2014-12-04 NOTE — H&P (Signed)
Tyler Buchanan is an 53 y.o. male.   Chief Complaint: Patient is here for EGD and ED. HPI: Patient is 53 year old Caucasian male who has chronic GERD and maintenance PPI with good symptom control. Tyler Buchanan presents with few month history of intermittent solid food dysphagia. Tyler Buchanan denies nausea vomiting abdominal pain or melena. Tyler Buchanan has good appetite. Last EGD/ED was in March 2008 and was found to have erosive reflux esophagitis and a hiatal hernia.  Past Medical History  Diagnosis Date  . Acid reflux   . DDD (degenerative disc disease), lumbosacral   . Allergic rhinitis     Past Surgical History  Procedure Laterality Date  . Esophageal dilation      Family History  Problem Relation Age of Onset  . Arthritis    . Cancer    . Diabetes     Social History:  reports that Tyler Buchanan has never smoked. Tyler Buchanan does not have any smokeless tobacco history on file. Tyler Buchanan reports that Tyler Buchanan drinks alcohol. Tyler Buchanan reports that Tyler Buchanan does not use illicit drugs.  Allergies: No Known Allergies  Medications Prior to Admission  Medication Sig Dispense Refill  . acetaminophen (TYLENOL) 500 MG tablet Take 1,000 mg by mouth every 6 (six) hours as needed for mild pain.    . Cyanocobalamin (VITAMIN B-12 PO) Take 1 tablet by mouth daily.    . diazepam (VALIUM) 5 MG tablet Take 5 mg by mouth at bedtime.     . fexofenadine (ALLEGRA) 180 MG tablet Take 180 mg by mouth daily.      Marland Kitchen glucosamine-chondroitin 500-400 MG tablet Take 1 tablet by mouth daily.    . Multiple Vitamin (MULTIVITAMIN WITH MINERALS) TABS tablet Take 1 tablet by mouth daily.    Marland Kitchen omeprazole (PRILOSEC) 20 MG capsule Take 20 mg by mouth 2 (two) times daily.      . naproxen sodium (ANAPROX) 220 MG tablet Take 220 mg by mouth 2 (two) times daily with a meal.      No results found for this or any previous visit (from the past 48 hour(s)). No results found.  ROS  Blood pressure 131/87, pulse 69, temperature 97.8 F (36.6 C), temperature source Oral, resp. rate 13, height  6\' 3"  (1.905 m), weight 248 lb (112.492 kg), SpO2 98 %. Physical Exam  Constitutional: Tyler Buchanan appears well-developed and well-nourished.  HENT:  Mouth/Throat: Oropharynx is clear and moist.  Eyes: Conjunctivae are normal. No scleral icterus.  Neck: No thyromegaly present.  Cardiovascular: Normal rate, regular rhythm and normal heart sounds.   No murmur heard. Respiratory: Effort normal and breath sounds normal.  GI: Soft. Tyler Buchanan exhibits no distension and no mass. There is no tenderness.  Musculoskeletal: Tyler Buchanan exhibits no edema.  Lymphadenopathy:    Tyler Buchanan has no cervical adenopathy.  Neurological: Tyler Buchanan is alert.  Skin: Skin is warm and dry.     Assessment/Plan Solid food dysphagia. Chronic GERD. EGD with ED.  Arlee Bossard U 12/04/2014, 10:44 AM

## 2014-12-04 NOTE — Op Note (Signed)
EGD PROCEDURE REPORT  PATIENT:  Tyler Buchanan  MR#:  [id number] Birthdate:  January 24, 1961, 53 y.o., male Endoscopist:  Dr. Rogene Houston, MD Referred By:  Dr. Glo Herring, MD  Procedure Date: 12/04/2014  Procedure:   EGD with ED.  Indications:  Patient is 53 year old Caucasian male with history of erosive reflux esophagitis who presents with intermittent solid food dysphagia. He feels GERD symptoms are well controlled with therapy although he has occasional nocturnal regurgitation. Last EGD/ED was in March 2008. No structural abnormality was noted to esophagus but he responded to esophageal dilation.           Informed Consent:  The risks, benefits, alternatives & imponderables which include, but are not limited to, bleeding, infection, perforation, drug reaction and potential missed lesion have been reviewed.  The potential for biopsy, lesion removal, esophageal dilation, etc. have also been discussed.  Questions have been answered.  All parties agreeable.  Please see history & physical in medical record for more information.  Medications:  Demerol 100 mg IV Versed 12 mg IV Promethazine 25 mg IV and diluted form. Cetacaine spray topically for oropharyngeal anesthesia  Description of procedure:  The endoscope was introduced through the mouth and advanced to the second portion of the duodenum without difficulty or limitations. The mucosal surfaces were surveyed very carefully during advancement of the scope and upon withdrawal.  Findings:  Esophagus:  Normal mucosa of esophagus. Serrated or wavy GE junction with single erosion. No ring or stricture noted. GEJ:  40 cm Hiatus:  43 cm Stomach:  Stomach was empty and distended very well with insufflation. Folds in the proximal stomach were normal. Examination mucosa and body, antrum, pyloric channel, regular sinus fundus and cardia was normal. Duodenum:  Normal bulbar and post bulbar mucosa.  Therapeutic/Diagnostic Maneuvers Performed:    Esophagus was dilated by passing 56 [place] Maloney dilator to full insertion. Endoscope was passed again and no mucosal disruption noted. Biopsy was taken from GE junction looking for short segment Barrett's esophagus.  Complications:  None  Impression: Wavy gastroesophageal junction with single erosion but no evidence of ring or stricture formation. Small sliding hiatal hernia. Esophagus dilated by passing 56 [place] Maloney dilator but no disruption noted. Biopsy taken from GE junction to rule out short segment Barrett's.   Recommendations:  Anti-reflux measures reinforced. Continue omeprazole 20 mg by mouth twice a day. Can use Zantac OTC 150 mg of Pepcid OTC 20 mg daily when necessary for breakthrough symptoms. I will be contacting patient with biopsy results.  REHMAN,NAJEEB U  12/04/2014  11:23 AM  CC: Dr. Glo Herring., MD & Dr. Rayne Du ref. provider found

## 2014-12-07 ENCOUNTER — Encounter (HOSPITAL_COMMUNITY): Payer: Self-pay | Admitting: Internal Medicine

## 2014-12-14 ENCOUNTER — Other Ambulatory Visit (INDEPENDENT_AMBULATORY_CARE_PROVIDER_SITE_OTHER): Payer: Self-pay | Admitting: Internal Medicine

## 2014-12-14 ENCOUNTER — Telehealth (INDEPENDENT_AMBULATORY_CARE_PROVIDER_SITE_OTHER): Payer: Self-pay | Admitting: *Deleted

## 2014-12-14 MED ORDER — DEXLANSOPRAZOLE 60 MG PO CPDR: 60.0000 mg | DELAYED_RELEASE_CAPSULE | Freq: Every day | ORAL | Status: DC

## 2014-12-14 NOTE — Telephone Encounter (Signed)
Josel is returning Dr. Olevia Perches call. Would like for him to call his cell at [phone]. Has a couple of questions also.

## 2014-12-14 NOTE — Telephone Encounter (Signed)
Patient's call returned. He is having postprandial regurgitation with certain foods that he should not be eating to begin with. Swallowing has improved but not 100%. Prescription for Dexilant sent to his pharmacy. Tammy, please call if we have copay card for this medication.  Office visit in 6 months.

## 2014-12-15 ENCOUNTER — Encounter (INDEPENDENT_AMBULATORY_CARE_PROVIDER_SITE_OTHER): Payer: Self-pay | Admitting: *Deleted

## 2014-12-21 NOTE — Telephone Encounter (Signed)
Patient will be called if we have a Co pay card for Dexilant. Forwarded to [employer] for OV in 6 months.

## 2014-12-22 ENCOUNTER — Encounter (INDEPENDENT_AMBULATORY_CARE_PROVIDER_SITE_OTHER): Payer: Self-pay | Admitting: *Deleted

## 2014-12-22 NOTE — Telephone Encounter (Signed)
Apt scheduled for 06/22/15 with Dr. Laural Golden.

## 2015-06-22 ENCOUNTER — Encounter (INDEPENDENT_AMBULATORY_CARE_PROVIDER_SITE_OTHER): Payer: Self-pay | Admitting: Internal Medicine

## 2015-06-22 ENCOUNTER — Ambulatory Visit (INDEPENDENT_AMBULATORY_CARE_PROVIDER_SITE_OTHER): Payer: BLUE CROSS/BLUE SHIELD | Admitting: Internal Medicine

## 2015-06-22 VITALS — BP 130/78 | HR 72 | Temp 97.5°F | Resp 18 | Ht 75.0 in | Wt 235.0 lb

## 2015-06-22 DIAGNOSIS — K219 Gastro-esophageal reflux disease without esophagitis: Secondary | ICD-10-CM | POA: Diagnosis not present

## 2015-06-22 NOTE — Progress Notes (Signed)
Presenting complaint;  Follow-up for GERD and dysphagia.  Subjective:  Patient is 54 year old Caucasian male who has history of GERD. He underwent EGD with dilation December 2015 when he presented with solid food dysphagia and regurgitation. EGD revealed wavy GE junction without ring or stricture formation and a small sliding hiatal hernia. Esophagus was dilated by passing 56 [place] Maloney dilator but no mucosal disruption noted. He denies dysphagia. He rarely has heartburn while he is on medication. However he continues to have nocturnal regurgitation when he wakes up and feels he is drowning. He has to sit effort relief for few minutes. He agrees that he tends to eat too much. He has changes eating habits and trying to stay very from fatty and fight foods. He eats supper is about supper about 3 hours before he goes to bed. He has lost 13 pounds in the last 6 months. He says he writes bike and plays golf 5 times a week. He denies melena or rectal bleeding.   Current Medications: Outpatient Encounter Prescriptions as of 06/22/2015  Medication Sig  . acetaminophen (TYLENOL) 500 MG tablet Take 1,000 mg by mouth every 6 (six) hours as needed for mild pain.  . Cyanocobalamin (VITAMIN B-12 PO) Take 1 tablet by mouth daily.  Marland Kitchen dexlansoprazole (DEXILANT) 60 MG capsule Take 1 capsule (60 mg total) by mouth daily before breakfast.  . diazepam (VALIUM) 5 MG tablet Take 5 mg by mouth. Patient states that he takes at dinner.  . fexofenadine (ALLEGRA) 180 MG tablet Take 180 mg by mouth daily.    . Multiple Vitamin (MULTIVITAMIN WITH MINERALS) TABS tablet Take 1 tablet by mouth daily.  . naproxen sodium (ANAPROX) 220 MG tablet Take 220 mg by mouth 2 (two) times daily with a meal.  . tiZANidine (ZANAFLEX) 2 MG tablet Take by mouth at bedtime.  . [DISCONTINUED] glucosamine-chondroitin 500-400 MG tablet Take 1 tablet by mouth daily.   No facility-administered encounter medications on file as of 06/22/2015.      Objective: Blood pressure 130/78, pulse 72, temperature 97.5 F (36.4 C), temperature source Oral, resp. rate 18, height 6\' 3"  (1.905 m), weight 235 lb (106.595 kg). Patient is alert and in no acute distress. Conjunctiva is pink. Sclera is nonicteric Oropharyngeal mucosa is normal. No neck masses or thyromegaly noted. Cardiac exam with regular rhythm normal S1 and S2. No murmur or gallop noted. Lungs are clear to auscultation. Abdomen;  No LE edema or clubbing noted.   Assessment:  #1. GERD. Heartburn is well controlled with therapy. He still having intermittent nocturnal regurgitation which appears to be related to overeating. He needs to track his symptoms and find out which foods trigger nocturnal regurgitation and try to avoid them. #2. He is average risk for CRC. Last colonoscopy was in March 2012 with removal of small polyp which was lipoma. Next screening due in March 2022.   Plan:  Continue Dexilant 60 mg po qam. Office visit in one year.  My staff told me that he tore up after visit summary sheet stating that he did not need it.

## 2015-06-22 NOTE — Patient Instructions (Signed)
Call if Dexilant stops working. Continue anti-reflux measures as discussed.

## 2016-01-04 ENCOUNTER — Other Ambulatory Visit (INDEPENDENT_AMBULATORY_CARE_PROVIDER_SITE_OTHER): Payer: Self-pay | Admitting: Internal Medicine

## 2016-03-23 ENCOUNTER — Encounter (INDEPENDENT_AMBULATORY_CARE_PROVIDER_SITE_OTHER): Payer: Self-pay | Admitting: Internal Medicine

## 2016-07-11 ENCOUNTER — Ambulatory Visit (INDEPENDENT_AMBULATORY_CARE_PROVIDER_SITE_OTHER): Payer: BLUE CROSS/BLUE SHIELD | Admitting: Internal Medicine

## 2016-08-14 ENCOUNTER — Telehealth: Payer: Self-pay | Admitting: Orthopedic Surgery

## 2016-08-14 NOTE — Telephone Encounter (Signed)
Patient called today stating his right elbow was hurting and that he wanted to just come by and let you look at it.  This is a new problem for him.  Do you want me to adjust the schedule to allow him to come in this week or give him an appointment for Monday, August 28th at 3:50?  Please advise.  Thanks

## 2016-08-16 ENCOUNTER — Ambulatory Visit: Payer: BLUE CROSS/BLUE SHIELD | Admitting: Orthopedic Surgery

## 2016-08-16 NOTE — Telephone Encounter (Signed)
I spoke to him by phone

## 2016-10-02 ENCOUNTER — Other Ambulatory Visit (INDEPENDENT_AMBULATORY_CARE_PROVIDER_SITE_OTHER): Payer: Self-pay | Admitting: Internal Medicine

## 2017-01-24 ENCOUNTER — Encounter: Payer: Self-pay | Admitting: Internal Medicine

## 2017-11-12 DIAGNOSIS — Z79899 Other long term (current) drug therapy: Secondary | ICD-10-CM | POA: Diagnosis not present

## 2017-11-15 DIAGNOSIS — R739 Hyperglycemia, unspecified: Secondary | ICD-10-CM | POA: Diagnosis not present

## 2018-05-27 DIAGNOSIS — M25579 Pain in unspecified ankle and joints of unspecified foot: Secondary | ICD-10-CM | POA: Diagnosis not present

## 2018-05-27 DIAGNOSIS — M79671 Pain in right foot: Secondary | ICD-10-CM | POA: Diagnosis not present

## 2018-11-12 DIAGNOSIS — Z6830 Body mass index (BMI) 30.0-30.9, adult: Secondary | ICD-10-CM | POA: Diagnosis not present

## 2018-11-12 DIAGNOSIS — E6609 Other obesity due to excess calories: Secondary | ICD-10-CM | POA: Diagnosis not present

## 2018-11-12 DIAGNOSIS — Z0000 Encounter for general adult medical examination without abnormal findings: Secondary | ICD-10-CM | POA: Diagnosis not present

## 2019-09-02 ENCOUNTER — Other Ambulatory Visit (HOSPITAL_COMMUNITY): Payer: Self-pay | Admitting: Physician Assistant

## 2019-09-02 DIAGNOSIS — R059 Cough, unspecified: Secondary | ICD-10-CM

## 2019-09-02 DIAGNOSIS — R05 Cough: Secondary | ICD-10-CM

## 2021-02-16 ENCOUNTER — Encounter (INDEPENDENT_AMBULATORY_CARE_PROVIDER_SITE_OTHER): Payer: Self-pay | Admitting: *Deleted

## 2021-09-08 ENCOUNTER — Other Ambulatory Visit (INDEPENDENT_AMBULATORY_CARE_PROVIDER_SITE_OTHER): Payer: Self-pay

## 2021-09-08 DIAGNOSIS — Z1211 Encounter for screening for malignant neoplasm of colon: Secondary | ICD-10-CM

## 2021-09-15 ENCOUNTER — Other Ambulatory Visit (INDEPENDENT_AMBULATORY_CARE_PROVIDER_SITE_OTHER): Payer: Self-pay

## 2021-10-05 ENCOUNTER — Encounter (INDEPENDENT_AMBULATORY_CARE_PROVIDER_SITE_OTHER): Payer: Self-pay

## 2021-10-05 ENCOUNTER — Telehealth (INDEPENDENT_AMBULATORY_CARE_PROVIDER_SITE_OTHER): Payer: Self-pay

## 2021-10-05 MED ORDER — PEG 3350-KCL-NA BICARB-NACL 420 G PO SOLR: 4000.0000 mL | ORAL | 0 refills | Status: AC

## 2021-10-05 NOTE — Telephone Encounter (Signed)
LeighAnn Tonee Silverstein, CMA  

## 2021-10-05 NOTE — Telephone Encounter (Signed)
Tyler Buchanan, CMA  

## 2021-11-01 ENCOUNTER — Other Ambulatory Visit (INDEPENDENT_AMBULATORY_CARE_PROVIDER_SITE_OTHER): Payer: Self-pay

## 2021-11-03 ENCOUNTER — Ambulatory Visit (HOSPITAL_COMMUNITY)
Admission: RE | Admit: 2021-11-03 | Discharge: 2021-11-03 | Disposition: A | Discharge status: Home / Self Care | Payer: PRIVATE HEALTH INSURANCE | Attending: Internal Medicine | Admitting: Internal Medicine

## 2021-11-03 ENCOUNTER — Encounter (HOSPITAL_COMMUNITY): Payer: Self-pay | Admitting: Internal Medicine

## 2021-11-03 ENCOUNTER — Encounter (HOSPITAL_COMMUNITY)
Admission: RE | Disposition: A | Discharge status: Home / Self Care | Payer: Self-pay | Source: Home / Self Care | Attending: Internal Medicine

## 2021-11-03 ENCOUNTER — Other Ambulatory Visit: Payer: Self-pay

## 2021-11-03 ENCOUNTER — Encounter (INDEPENDENT_AMBULATORY_CARE_PROVIDER_SITE_OTHER): Payer: Self-pay | Admitting: *Deleted

## 2021-11-03 DIAGNOSIS — K5730 Diverticulosis of large intestine without perforation or abscess without bleeding: Secondary | ICD-10-CM | POA: Insufficient documentation

## 2021-11-03 DIAGNOSIS — Z1211 Encounter for screening for malignant neoplasm of colon: Secondary | ICD-10-CM | POA: Insufficient documentation

## 2021-11-03 HISTORY — PX: COLONOSCOPY: SHX5424

## 2021-11-03 LAB — HM COLONOSCOPY

## 2021-11-03 SURGERY — COLONOSCOPY
Anesthesia: Moderate Sedation

## 2021-11-03 MED ORDER — MIDAZOLAM HCL 5 MG/5ML IJ SOLN
INTRAMUSCULAR | Status: AC
  Filled 2021-11-03: qty 5

## 2021-11-03 MED ORDER — MEPERIDINE HCL 50 MG/ML IJ SOLN
INTRAMUSCULAR | Status: AC
  Filled 2021-11-03: qty 1

## 2021-11-03 MED ORDER — SODIUM CHLORIDE 0.9 % IV SOLN: INTRAVENOUS | Status: DC

## 2021-11-03 MED ORDER — MEPERIDINE HCL 50 MG/ML IJ SOLN
INTRAMUSCULAR | Status: DC | PRN
  Administered 2021-11-03 (×4): 25 mg via INTRAVENOUS

## 2021-11-03 MED ORDER — MIDAZOLAM HCL 5 MG/5ML IJ SOLN
INTRAMUSCULAR | Status: AC
  Filled 2021-11-03: qty 10

## 2021-11-03 MED ORDER — STERILE WATER FOR IRRIGATION IR SOLN
Status: DC | PRN
  Administered 2021-11-03: 100 mL

## 2021-11-03 MED ORDER — MIDAZOLAM HCL 5 MG/5ML IJ SOLN
INTRAMUSCULAR | Status: DC | PRN
  Administered 2021-11-03: 3 mg via INTRAVENOUS
  Administered 2021-11-03: 1 mg via INTRAVENOUS
  Administered 2021-11-03 (×2): 3 mg via INTRAVENOUS
  Administered 2021-11-03: 2 mg via INTRAVENOUS

## 2021-11-03 NOTE — H&P (Signed)
Tyler Buchanan is an 60 y.o. male.   Chief Complaint: Patient is here for colonoscopy. HPI: Patient is 60 year old Caucasian male who is here for screening colonoscopy.  Last exam was normal 10 years ago.  He denies abdominal pain change in bowel habits or rectal bleeding.  He is on NSAID but does not take aspirin or anticoagulants. Family history is negative for colorectal carcinoma.  Past Medical History:  Diagnosis Date   Acid reflux    Allergic rhinitis    DDD (degenerative disc disease), lumbosacral     Past Surgical History:  Procedure Laterality Date   ESOPHAGEAL DILATION     ESOPHAGOGASTRODUODENOSCOPY N/A 12/04/2014   Procedure: ESOPHAGOGASTRODUODENOSCOPY (EGD);  Surgeon: Rogene Houston, MD;  Location: [facility];  Service: Endoscopy;  Laterality: N/A;  [place] N/A 12/04/2014   Procedure: Venia Minks DILATION;  Surgeon: Rogene Houston, MD;  Location: [facility];  Service: Endoscopy;  Laterality: N/A;    Family History  Problem Relation Age of Onset   Arthritis Other    Cancer Other    Diabetes Other    Social History:  reports that he has never smoked. He has never used smokeless tobacco. He reports current alcohol use. He reports that he does not use drugs.  Allergies: No Known Allergies  Medications Prior to Admission  Medication Sig Dispense Refill   acetaminophen (TYLENOL) 500 MG tablet Take 500 mg by mouth 2 (two) times daily as needed for mild pain.     Cyanocobalamin (VITAMIN B-12 PO) Take 1 tablet by mouth daily.     diazepam (VALIUM) 10 MG tablet Take 5 mg by mouth at bedtime.     esomeprazole (NEXIUM) 40 MG capsule Take 40 mg by mouth every morning.     fexofenadine (ALLEGRA) 180 MG tablet Take 180 mg by mouth daily.       meloxicam (MOBIC) 15 MG tablet Take 15 mg by mouth daily.     Multiple Vitamin (MULTIVITAMIN WITH MINERALS) TABS tablet Take 1 tablet by mouth daily.     polyethylene glycol-electrolytes (TRILYTE) 420 g solution Take 4,000  mLs by mouth as directed. 4000 mL 0   sildenafil (VIAGRA) 50 MG tablet Take 50 mg by mouth daily as needed for erectile dysfunction.     tiZANidine (ZANAFLEX) 4 MG tablet Take 2 mg by mouth at bedtime.      No results found for this or any previous visit (from the past 48 hour(s)). No results found.  Review of Systems  Blood pressure (!) 157/86, pulse 71, temperature 97.8 F (36.6 C), temperature source Oral, resp. rate 15, SpO2 98 %. Physical Exam HENT:     Mouth/Throat:     Mouth: Mucous membranes are moist.     Pharynx: Oropharynx is clear.  Eyes:     General: No scleral icterus.    Conjunctiva/sclera: Conjunctivae normal.  Cardiovascular:     Rate and Rhythm: Normal rate and regular rhythm.     Heart sounds: Normal heart sounds. No murmur heard. Pulmonary:     Effort: Pulmonary effort is normal.     Breath sounds: Normal breath sounds.  Abdominal:     General: There is no distension.     Palpations: Abdomen is soft. There is no mass.     Tenderness: There is no abdominal tenderness.  Musculoskeletal:        General: No swelling.     Cervical back: Neck supple.  Lymphadenopathy:  Cervical: No cervical adenopathy.  Skin:    General: Skin is warm and dry.  Neurological:     Mental Status: He is alert.     Assessment/Plan  Average risk screening colonoscopy.  Hildred Laser, MD 11/03/2021, 9:47 AM

## 2021-11-03 NOTE — Op Note (Signed)
[facility] Patient Name: Tyler Buchanan Procedure Date: 11/03/2021 9:25 AM MRN: [id number] Date of Birth: [birth year] Attending MD: Hildred Laser , MD CSN: [id number] Age: 60 Admit Type: Outpatient Procedure:                Colonoscopy Indications:              Screening for colorectal malignant neoplasm Providers:                Hildred Laser, MD, Rosina Lowenstein, RN, Raphael Gibney,                            Technician Referring MD:             Redmond School, MD Medicines:                Meperidine 100 mg IV, Midazolam 12 mg IV Complications:            No immediate complications. Estimated Blood Loss:     Estimated blood loss: none. Procedure:                Pre-Anesthesia Assessment:                           - Prior to the procedure, a History and Physical                            was performed, and patient medications and                            allergies were reviewed. The patient's tolerance of                            previous anesthesia was also reviewed. The risks                            and benefits of the procedure and the sedation                            options and risks were discussed with the patient.                            All questions were answered, and informed consent                            was obtained. Prior Anticoagulants: The patient has                            taken no previous anticoagulant or antiplatelet                            agents. ASA Grade Assessment: II - A patient with                            mild systemic disease. After reviewing the risks  and benefits, the patient was deemed in                            satisfactory condition to undergo the procedure.                           After obtaining informed consent, the colonoscope                            was passed under direct vision. Throughout the                            procedure, the patient's blood pressure, pulse, and                             oxygen saturations were monitored continuously. The                            [id number]-HQ190L ([id number]) scope was introduced through                            the anus and advanced to the the cecum, identified                            by appendiceal orifice and ileocecal valve. The                            colonoscopy was performed without difficulty. The                            patient tolerated the procedure well. The quality                            of the bowel preparation was excellent. The                            ileocecal valve, appendiceal orifice, and rectum                            were photographed. Scope In: 10:03:03 AM Scope Out: 10:20:04 AM Scope Withdrawal Time: 0 hours 12 minutes 18 seconds  Total Procedure Duration: 0 hours 17 minutes 1 second  Findings:      The perianal and digital rectal examinations were normal.      Scattered diverticula were found in the sigmoid colon and hepatic       flexure.      The retroflexed view of the distal rectum and anal verge was normal and       showed no anal or rectal abnormalities. Impression:               - Diverticulosis in the sigmoid colon and at the                            hepatic flexure.                           -  No specimens collected. Moderate Sedation:      Moderate (conscious) sedation was administered by the endoscopy nurse       and supervised by the endoscopist. The following parameters were       monitored: oxygen saturation, heart rate, blood pressure, CO2       capnography and response to care. Total physician intraservice time was       26 minutes. Recommendation:           - Patient has a contact number available for                            emergencies. The signs and symptoms of potential                            delayed complications were discussed with the                            patient. Return to normal activities tomorrow.                            Written  discharge instructions were provided to the                            patient.                           - High fiber diet today.                           - Continue present medications.                           - Repeat colonoscopy in 10 years for screening                            purposes. Procedure Code(s):        --- Professional ---                           [phone], Colonoscopy, flexible; diagnostic, including                            collection of specimen(s) by brushing or washing,                            when performed (separate procedure) Diagnosis Code(s):        --- Professional ---                           Z12.11, Encounter for screening for malignant                            neoplasm of colon                           K57.30, Diverticulosis of large intestine without  perforation or abscess without bleeding CPT copyright 2019 [employer]. All rights reserved. The codes documented in this report are preliminary and upon coder review may  be revised to meet current compliance requirements. Hildred Laser, MD Hildred Laser, MD 11/03/2021 10:29:44 AM This report has been signed electronically. Number of Addenda: 0

## 2021-11-03 NOTE — Discharge Instructions (Addendum)
Resume usual medications as before. High-fiber diet. No driving for 24 hours. Next screening exam in 10 years  PATIENT INSTRUCTIONS POST-ANESTHESIA  IMMEDIATELY FOLLOWING SURGERY:  Do not drive or operate machinery for the first twenty four hours after surgery.  Do not make any important decisions for twenty four hours after surgery or while taking narcotic pain medications or sedatives.  If you develop intractable nausea and vomiting or a severe headache please notify your doctor immediately.  FOLLOW-UP:  Please make an appointment with your surgeon as instructed. You do not need to follow up with anesthesia unless specifically instructed to do so.  WOUND CARE INSTRUCTIONS (if applicable):  Keep a dry clean dressing on the anesthesia/puncture wound site if there is drainage.  Once the wound has quit draining you may leave it open to air.  Generally you should leave the bandage intact for twenty four hours unless there is drainage.  If the epidural site drains for more than 36-48 hours please call the anesthesia department.  QUESTIONS?:  Please feel free to call your physician or the hospital operator if you have any questions, and they will be happy to assist you.

## 2021-11-09 ENCOUNTER — Encounter (HOSPITAL_COMMUNITY): Payer: Self-pay | Admitting: Internal Medicine
